# Patient Record
Sex: Male | Born: 1995 | Race: Black or African American | Hispanic: No | Marital: Single | Smoking: Never smoker
Health system: Southern US, Community
[De-identification: ages and names within clinical notes are randomized; demographics above are authoritative.]

---
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
## 2014-01-13 ENCOUNTER — Encounter (HOSPITAL_BASED_OUTPATIENT_CLINIC_OR_DEPARTMENT_OTHER): Payer: Self-pay | Admitting: Emergency Medicine

## 2014-01-13 ENCOUNTER — Emergency Department (HOSPITAL_BASED_OUTPATIENT_CLINIC_OR_DEPARTMENT_OTHER)
Admission: EM | Admit: 2014-01-13 | Discharge: 2014-01-13 | Disposition: A | Discharge status: Home / Self Care | Payer: 59 | Attending: Emergency Medicine | Admitting: Emergency Medicine

## 2014-01-13 DIAGNOSIS — R21 Rash and other nonspecific skin eruption: Secondary | ICD-10-CM | POA: Insufficient documentation

## 2014-01-13 DIAGNOSIS — F17200 Nicotine dependence, unspecified, uncomplicated: Secondary | ICD-10-CM | POA: Diagnosis not present

## 2014-01-13 MED ORDER — PREDNISONE 10 MG PO TABS: ORAL_TABLET | ORAL | Status: DC

## 2014-01-13 MED ORDER — METHYLPREDNISOLONE SODIUM SUCC 125 MG IJ SOLR
INTRAMUSCULAR | Status: AC
  Filled 2014-01-13: qty 2

## 2014-01-13 MED ORDER — METHYLPREDNISOLONE SODIUM SUCC 125 MG IJ SOLR
125.0000 mg | Freq: Once | INTRAMUSCULAR | Status: AC
  Administered 2014-01-13: 125 mg via INTRAVENOUS

## 2014-01-13 NOTE — ED Provider Notes (Signed)
CSN: [id number]     Arrival date & time 01/13/14  1346 History   First MD Initiated Contact with Patient 01/13/14 1405     Chief Complaint  Patient presents with  . Rash     (Consider location/radiation/quality/duration/timing/severity/associated sxs/prior Treatment) Patient is a 18 y.o. male presenting with rash. The history is provided by the patient. No language interpreter was used.  Rash Location:  Full body Quality: itchiness and redness   Severity:  Severe Duration:  8 days Timing:  Constant Progression:  Worsening Chronicity:  New Context: food and new detergent/soap   Relieved by:  Nothing Worsened by:  Nothing tried Associated symptoms: no abdominal pain, no nausea and no shortness of breath     History reviewed. No pertinent past medical history. History reviewed. No pertinent past surgical history. No family history on file. History  Substance Use Topics  . Smoking status: Current Every Day Smoker  . Smokeless tobacco: Not on file  . Alcohol Use: No    Review of Systems  Respiratory: Negative for shortness of breath.   Gastrointestinal: Negative for nausea and abdominal pain.  Skin: Positive for rash.  All other systems reviewed and are negative.     Allergies  Shellfish allergy  Home Medications   Prior to Admission medications   Not on File   BP 122/92  Pulse 68  Temp(Src) 97.5 F (36.4 C) (Oral)  Resp 16  Ht 5\' 8"  (1.727 m)  Wt 140 lb (63.504 kg)  BMI 21.29 kg/m2  SpO2 100% Physical Exam  Nursing note and vitals reviewed. Constitutional: He appears well-developed and well-nourished.  HENT:  Head: Normocephalic and atraumatic.  Eyes: Pupils are equal, round, and reactive to light.  Cardiovascular: Normal rate.   Pulmonary/Chest: Effort normal.  Abdominal: Soft.  Musculoskeletal: Normal range of motion.  Neurological: He is alert.  Skin: Rash noted.  Raised, erythematous rash worse on arms and legs,  Face and back spaired   Psychiatric: He has a normal mood and affect.    ED Course  Procedures (including critical care time) Labs Review Labs Reviewed - No data to display  Imaging Review No results found.   EKG Interpretation None      MDM   Final diagnoses:  Rash    I suspect allergic rash.   Pt given solumedrol  125 IM.  Prednisone taper, benadryl po.      Lonia SkinnerLeslie K AuburnSofia, PA-C 01/13/14 1427

## 2014-01-13 NOTE — ED Notes (Signed)
pa at bedside. 

## 2014-01-13 NOTE — Discharge Instructions (Signed)
Contact Dermatitis Contact dermatitis is a reaction to certain substances that touch the skin. Contact dermatitis can be either irritant contact dermatitis or allergic contact dermatitis. Irritant contact dermatitis does not require previous exposure to the substance for a reaction to occur.Allergic contact dermatitis only occurs if you have been exposed to the substance before. Upon a repeat exposure, your body reacts to the substance.  CAUSES  Many substances can cause contact dermatitis. Irritant dermatitis is most commonly caused by repeated exposure to mildly irritating substances, such as:  Makeup.  Soaps.  Detergents.  Bleaches.  Acids.  Metal salts, such as nickel. Allergic contact dermatitis is most commonly caused by exposure to:  Poisonous plants.  Chemicals (deodorants, shampoos).  Jewelry.  Latex.  Neomycin in triple antibiotic cream.  Preservatives in products, including clothing. SYMPTOMS  The area of skin that is exposed may develop:  Dryness or flaking.  Redness.  Cracks.  Itching.  Pain or a burning sensation.  Blisters. With allergic contact dermatitis, there may also be swelling in areas such as the eyelids, mouth, or genitals.  DIAGNOSIS  Your caregiver can usually tell what the problem is by doing a physical exam. In cases where the cause is uncertain and an allergic contact dermatitis is suspected, a patch skin test may be performed to help determine the cause of your dermatitis. TREATMENT Treatment includes protecting the skin from further contact with the irritating substance by avoiding that substance if possible. Barrier creams, powders, and gloves may be helpful. Your caregiver may also recommend:  Steroid creams or ointments applied 2 times daily. For best results, soak the rash area in cool water for 20 minutes. Then apply the medicine. Cover the area with a plastic wrap. You can store the steroid cream in the refrigerator for a "chilly"  effect on your rash. That may decrease itching. Oral steroid medicines may be needed in more severe cases.  Antibiotics or antibacterial ointments if a skin infection is present.  Antihistamine lotion or an antihistamine taken by mouth to ease itching.  Lubricants to keep moisture in your skin.  Burow's solution to reduce redness and soreness or to dry a weeping rash. Mix one packet or tablet of solution in 2 cups cool water. Dip a clean washcloth in the mixture, wring it out a bit, and put it on the affected area. Leave the cloth in place for 30 minutes. Do this as often as possible throughout the day.  Taking several cornstarch or baking soda baths daily if the area is too large to cover with a washcloth. Harsh chemicals, such as alkalis or acids, can cause skin damage that is like a burn. You should flush your skin for 15 to 20 minutes with cold water after such an exposure. You should also seek immediate medical care after exposure. Bandages (dressings), antibiotics, and pain medicine may be needed for severely irritated skin.  HOME CARE INSTRUCTIONS  Avoid the substance that caused your reaction.  Keep the area of skin that is affected away from hot water, soap, sunlight, chemicals, acidic substances, or anything else that would irritate your skin.  Do not scratch the rash. Scratching may cause the rash to become infected.  You may take cool baths to help stop the itching.  Only take over-the-counter or prescription medicines as directed by your caregiver.  See your caregiver for follow-up care as directed to make sure your skin is healing properly. SEEK MEDICAL CARE IF:   Your condition is not better after 3  days of treatment.  You seem to be getting worse.  You see signs of infection such as swelling, tenderness, redness, soreness, or warmth in the affected area.  You have any problems related to your medicines. Document Released: 05/19/2000 Document Revised: 08/14/2011  Document Reviewed: 10/25/2010 [facility] Patient Information 2015 [place], [place]. This information is not intended to replace advice given to you by your health care provider. Make sure you discuss any questions you have with your health care provider.  Allergies  Allergies may happen from anything your body is sensitive to. This may be food, medicines, pollens, chemicals, and many other things. Food allergies can be severe and deadly.  HOME CARE  If you do not know what causes a reaction, keep a diary. Write down the foods you ate and the symptoms that followed. Avoid foods that cause reactions.  If you have red raised spots (hives) or a rash:  Take medicine as told by your doctor.  Use medicines for red raised spots and itching as needed.  Apply cold cloths (compresses) to the skin. Take a cool bath. Avoid hot baths or showers.  If you are severely allergic:  It is often necessary to go to the hospital after you have treated your reaction.  Wear your medical alert jewelry.  You and your family must learn how to give a allergy shot or use an allergy kit (anaphylaxis kit).  Always carry your allergy kit or shot with you. Use this medicine as told by your doctor if a severe reaction is occurring. GET HELP RIGHT AWAY IF:  You have trouble breathing or are making high-pitched whistling sounds (wheezing).  You have a tight feeling in your chest or throat.  You have a puffy (swollen) mouth.  You have red raised spots, puffiness (swelling), or itching all over your body.  You have had a severe reaction that was helped by your allergy kit or shot. The reaction can return once the medicine has worn off.  You think you are having a food allergy. Symptoms most often happen within 30 minutes of eating a food.  Your symptoms have not gone away within 2 days or are getting worse.  You have new symptoms.  You want to retest yourself with a food or drink you think causes an allergic  reaction. Only do this under the care of a doctor. MAKE SURE YOU:   Understand these instructions.  Will watch your condition.  Will get help right away if you are not doing well or get worse. Document Released: 09/16/2012 Document Reviewed: 09/16/2012 [facility] Patient Information 2015 [place]. This information is not intended to replace advice given to you by your health care provider. Make sure you discuss any questions you have with your health care provider.

## 2014-01-13 NOTE — ED Notes (Signed)
C/o generalized rash started 8 days ago

## 2014-01-15 NOTE — ED Provider Notes (Signed)
Medical screening examination/treatment/procedure(s) were performed by non-physician practitioner and as supervising physician I was immediately available for consultation/collaboration.  Euleta Belson J. Brock Mokry, MD 01/15/14 1747 

## 2016-01-29 ENCOUNTER — Emergency Department (HOSPITAL_BASED_OUTPATIENT_CLINIC_OR_DEPARTMENT_OTHER)
Admission: EM | Admit: 2016-01-29 | Discharge: 2016-01-29 | Disposition: A | Discharge status: Home / Self Care | Payer: BLUE CROSS/BLUE SHIELD | Attending: Emergency Medicine | Admitting: Emergency Medicine

## 2016-01-29 ENCOUNTER — Encounter (HOSPITAL_BASED_OUTPATIENT_CLINIC_OR_DEPARTMENT_OTHER): Payer: Self-pay | Admitting: Emergency Medicine

## 2016-01-29 DIAGNOSIS — E876 Hypokalemia: Secondary | ICD-10-CM | POA: Insufficient documentation

## 2016-01-29 DIAGNOSIS — R197 Diarrhea, unspecified: Secondary | ICD-10-CM | POA: Diagnosis not present

## 2016-01-29 DIAGNOSIS — F17200 Nicotine dependence, unspecified, uncomplicated: Secondary | ICD-10-CM | POA: Diagnosis not present

## 2016-01-29 DIAGNOSIS — R1084 Generalized abdominal pain: Secondary | ICD-10-CM | POA: Diagnosis present

## 2016-01-29 DIAGNOSIS — E860 Dehydration: Secondary | ICD-10-CM | POA: Insufficient documentation

## 2016-01-29 DIAGNOSIS — R143 Flatulence: Secondary | ICD-10-CM | POA: Diagnosis not present

## 2016-01-29 LAB — CBC WITH DIFFERENTIAL/PLATELET
BASOS ABS: 0 10*3/uL (ref 0.0–0.1)
Band Neutrophils: 9 %
Basophils Relative: 0 %
EOS ABS: 0.1 10*3/uL (ref 0.0–0.7)
EOS PCT: 1 %
HEMATOCRIT: 45.7 % (ref 39.0–52.0)
HEMOGLOBIN: 16.2 g/dL (ref 13.0–17.0)
Lymphocytes Relative: 26 %
Lymphs Abs: 2.8 10*3/uL (ref 0.7–4.0)
MCH: 30.5 pg (ref 26.0–34.0)
MCHC: 35.4 g/dL (ref 30.0–36.0)
MCV: 85.9 fL (ref 78.0–100.0)
MONOS PCT: 8 %
Monocytes Absolute: 0.9 10*3/uL (ref 0.1–1.0)
NEUTROS ABS: 6.9 10*3/uL (ref 1.7–7.7)
NEUTROS PCT: 56 %
Platelets: 245 10*3/uL (ref 150–400)
RBC: 5.32 MIL/uL (ref 4.22–5.81)
RDW: 12.3 % (ref 11.5–15.5)
WBC: 10.7 10*3/uL — AB (ref 4.0–10.5)

## 2016-01-29 LAB — COMPREHENSIVE METABOLIC PANEL
ALBUMIN: 3.8 g/dL (ref 3.5–5.0)
ALK PHOS: 56 U/L (ref 38–126)
ALT: 14 U/L — ABNORMAL LOW (ref 17–63)
ANION GAP: 7 (ref 5–15)
AST: 18 U/L (ref 15–41)
BILIRUBIN TOTAL: 0.5 mg/dL (ref 0.3–1.2)
BUN: 11 mg/dL (ref 6–20)
CO2: 28 mmol/L (ref 22–32)
Calcium: 9 mg/dL (ref 8.9–10.3)
Chloride: 102 mmol/L (ref 101–111)
Creatinine, Ser: 1.03 mg/dL (ref 0.61–1.24)
GFR calc Af Amer: >60 mL/min (ref >60)
GFR calc non Af Amer: >60 mL/min (ref >60)
GLUCOSE: 94 mg/dL (ref 65–99)
POTASSIUM: 3.1 mmol/L — AB (ref 3.5–5.1)
SODIUM: 137 mmol/L (ref 135–145)
Total Protein: 6.6 g/dL (ref 6.5–8.1)

## 2016-01-29 LAB — MAGNESIUM: Magnesium: 2 mg/dL (ref 1.7–2.4)

## 2016-01-29 LAB — C DIFFICILE QUICK SCREEN W PCR REFLEX
C DIFFICILE (CDIFF) INTERP: NOT DETECTED
C DIFFICILE (CDIFF) TOXIN: NEGATIVE
C Diff antigen: NEGATIVE

## 2016-01-29 MED ORDER — POTASSIUM CHLORIDE 10 MEQ/100ML IV SOLN
10.0000 meq | Freq: Once | INTRAVENOUS | Status: AC
  Administered 2016-01-29: 10 meq via INTRAVENOUS
  Filled 2016-01-29: qty 100

## 2016-01-29 MED ORDER — ONDANSETRON HCL 4 MG PO TABS: 4.0000 mg | ORAL_TABLET | Freq: Four times a day (QID) | ORAL | 0 refills | Status: DC

## 2016-01-29 MED ORDER — ONDANSETRON HCL 4 MG/2ML IJ SOLN
4.0000 mg | Freq: Once | INTRAMUSCULAR | Status: AC
  Administered 2016-01-29: 4 mg via INTRAVENOUS
  Filled 2016-01-29: qty 2

## 2016-01-29 MED ORDER — SODIUM CHLORIDE 0.9 % IV BOLUS (SEPSIS)
1000.0000 mL | Freq: Once | INTRAVENOUS | Status: AC
  Administered 2016-01-29: 1000 mL via INTRAVENOUS

## 2016-01-29 MED ORDER — METRONIDAZOLE 500 MG PO TABS: 500.0000 mg | ORAL_TABLET | Freq: Two times a day (BID) | ORAL | 0 refills | Status: DC

## 2016-01-29 MED ORDER — LOPERAMIDE HCL 2 MG PO CAPS
4.0000 mg | ORAL_CAPSULE | Freq: Once | ORAL | Status: AC
  Administered 2016-01-29: 4 mg via ORAL
  Filled 2016-01-29: qty 2

## 2016-01-29 NOTE — ED Notes (Signed)
MD at bedside. 

## 2016-01-29 NOTE — ED Provider Notes (Signed)
[facility]   CSN: [id number] Arrival date & time: 01/29/16  1030     History   Chief Complaint Chief Complaint  Patient presents with  . Abdominal Pain    HPI Martin Payne is a 20 y.o. male.  Patient is a healthy 20 year old male who presents today with 2 weeks of worsening diarrhea and occasional nausea and vomiting. Patient states approximately 2 weeks ago he developed diarrhea which she was having numerous stools per day. The last thing he remembers eating was chicken from a fast food restaurant but it tasted normal. Then approximately 7 days ago the diarrhea became worse and he is now having usually 1-2 episodes of vomiting per day. Anytime he tries to eat or drink he has worsening diarrhea. Today he has already had 5 episodes of diarrhea. He denies any blood in his stool or vomit. In the evenings he gets chills but has not had a documented fever. No cough or shortness of breath. He has not had anything like this before. He denies any tobacco, drugs or alcohol. His doctor last week gave him a course of ciprofloxacin for concern for food borne illness but that just made him feel worse. He denies any antibiotic exposure in the last few months except for the Cipro once his symptoms had started. No travel and no other sick contacts.   The history is provided by the patient.  Abdominal Pain   This is a new problem. Episode onset: 2 weeks. The problem occurs constantly. The problem has not changed since onset.The pain is associated with an unknown factor. The pain is located in the generalized abdominal region. The pain is at a severity of 4/10. The pain is moderate. Associated symptoms include diarrhea, flatus, nausea and vomiting. Pertinent negatives include melena, constipation, dysuria, headaches and arthralgias. Associated symptoms comments: Chills. The symptoms are aggravated by eating. Nothing relieves the symptoms. Past workup comments: Patient saw PCP last  week and was given Cipro due to concern for food borne illness or just made him feel worse. His past medical history does not include ulcerative colitis or Crohn's disease.    No past medical history on file.  There are no active problems to display for this patient.   No past surgical history on file.     Home Medications    Prior to Admission medications   Not on File    Family History No family history on file.  Social History Social History  Substance Use Topics  . Smoking status: Current Every Day Smoker  . Smokeless tobacco: Never Used  . Alcohol use No     Allergies   Shellfish allergy   Review of Systems Review of Systems  Constitutional: Positive for appetite change, chills and unexpected weight change.       Since illness started patient has lost 10 pounds  Gastrointestinal: Positive for abdominal pain, diarrhea, flatus, nausea and vomiting. Negative for constipation and melena.  Genitourinary: Negative for dysuria.  Musculoskeletal: Negative for arthralgias.  Neurological: Negative for headaches.  All other systems reviewed and are negative.    Physical Exam Updated Vital Signs BP 134/85 (BP Location: Right Arm)   Pulse 70   Temp 98.2 F (36.8 C) (Oral)   Resp 18   Ht 5\' 8"  (1.727 m)   Wt 125 lb (56.7 kg)   SpO2 97%   BMI 19.01 kg/m   Physical Exam  Constitutional: He is oriented to person, place, and time. He appears well-developed  and well-nourished. No distress.  HENT:  Head: Normocephalic and atraumatic.  Mouth/Throat: Oropharynx is clear and moist. Mucous membranes are dry.  Eyes: Conjunctivae and EOM are normal. Pupils are equal, round, and reactive to light.  Neck: Normal range of motion. Neck supple.  Cardiovascular: Normal rate, regular rhythm and intact distal pulses.   No murmur heard. Pulmonary/Chest: Effort normal and breath sounds normal. No respiratory distress. He has no wheezes. He has no rales.  Abdominal: Soft. He  exhibits no distension. There is tenderness. There is no rebound and no guarding.  Mild diffuse tenderness  Musculoskeletal: Normal range of motion. He exhibits no edema or tenderness.  Neurological: He is alert and oriented to person, place, and time.  Skin: Skin is warm and dry. No rash noted. No erythema.  Psychiatric: He has a normal mood and affect. His behavior is normal.  Nursing note and vitals reviewed.    ED Treatments / Results  Labs (all labs ordered are listed, but only abnormal results are displayed) Labs Reviewed  CBC WITH DIFFERENTIAL/PLATELET - Abnormal; Notable for the following:       Result Value   WBC 10.7 (*)    All other components within normal limits  COMPREHENSIVE METABOLIC PANEL - Abnormal; Notable for the following:    Potassium 3.1 (*)    ALT 14 (*)    All other components within normal limits  GASTROINTESTINAL PANEL BY PCR, STOOL (REPLACES STOOL CULTURE)  C DIFFICILE QUICK SCREEN W PCR REFLEX  MAGNESIUM    EKG  EKG Interpretation None       Radiology No results found.  Procedures Procedures (including critical care time)  Medications Ordered in ED Medications  potassium chloride 10 mEq in 100 mL IVPB (10 mEq Intravenous New Bag/Given 01/29/16 1205)  sodium chloride 0.9 % bolus 1,000 mL (0 mLs Intravenous Stopped 01/29/16 1204)  ondansetron (ZOFRAN) injection 4 mg (4 mg Intravenous Given 01/29/16 1109)     Initial Impression / Assessment and Plan / ED Course  I have reviewed the triage vital signs and the nursing notes.  Pertinent labs & imaging results that were available during my care of the patient were reviewed by me and considered in my medical decision making (see chart for details).  Clinical Course    Patient is a healthy 20 year old male presenting today with a two-week history of profuse diarrhea and intermittent vomiting. Documented fever but poor by mouth intake with significant weight loss of 10 pounds in the last 2  weeks.  Patient seen last week and given Cipro for concern for potential food poisoning however he has not had any stool samples done. Patient is hemodynamically stable today with mild diffuse abdominal tenderness but no peritoneal signs. He denies having any blood in his stool and no prior history of Crohn's disease or ulcerative colitis. Patient has not been on any other antibiotics or had any travel. Concern for potential C. difficile as patient is having numerous stools which is classic for C. difficile. Low suspicion for appendicitis, viscus, pancreatitis, cholecystitis or diverticulitis.  CBC, CMP, GI pathogen panel and C. difficile PCR pending. Patient given IV fluids and Zofran.  12:10 PM Mild hypokalemia at 3.1. Patient feeling better after IV fluids and Zofran. Patient given ginger ale and chronic sinus and attempts to obtain a stool sample  1:57 PM  Pt given flagyl for concern for c-diff and to use probiotic and zofran prn.    Final Clinical Impressions(s) / ED Diagnoses   Final  diagnoses:  Diarrhea, unspecified type  Dehydration  Hypokalemia    New Prescriptions New Prescriptions   METRONIDAZOLE (FLAGYL) 500 MG TABLET    Take 1 tablet (500 mg total) by mouth 2 (two) times daily.   ONDANSETRON (ZOFRAN) 4 MG TABLET    Take 1 tablet (4 mg total) by mouth every 6 (six) hours.     Gwyneth Sprout, MD 01/29/16 1359

## 2016-01-29 NOTE — ED Triage Notes (Signed)
Pt having N/V/D for over two weeks.  Pt having increased diarrhea.  Pt denies fever but feels hot.  Pt states he recently has been on cipro without relief.

## 2016-01-30 ENCOUNTER — Telehealth (HOSPITAL_BASED_OUTPATIENT_CLINIC_OR_DEPARTMENT_OTHER): Payer: Self-pay | Admitting: *Deleted

## 2016-01-30 LAB — GASTROINTESTINAL PANEL BY PCR, STOOL (REPLACES STOOL CULTURE)
ADENOVIRUS F40/41: NOT DETECTED
Astrovirus: NOT DETECTED
CRYPTOSPORIDIUM: NOT DETECTED
Campylobacter species: DETECTED — AB
Cyclospora cayetanensis: NOT DETECTED
E. coli O157: NOT DETECTED
ENTAMOEBA HISTOLYTICA: NOT DETECTED
ENTEROAGGREGATIVE E COLI (EAEC): NOT DETECTED
Enteropathogenic E coli (EPEC): DETECTED — AB
Enterotoxigenic E coli (ETEC): NOT DETECTED
GIARDIA LAMBLIA: DETECTED — AB
NOROVIRUS GI/GII: NOT DETECTED
Plesimonas shigelloides: NOT DETECTED
Rotavirus A: NOT DETECTED
SALMONELLA SPECIES: NOT DETECTED
SHIGELLA/ENTEROINVASIVE E COLI (EIEC): NOT DETECTED
Sapovirus (I, II, IV, and V): NOT DETECTED
Shiga like toxin producing E coli (STEC): NOT DETECTED
VIBRIO CHOLERAE: NOT DETECTED
Vibrio species: NOT DETECTED
YERSINIA ENTEROCOLITICA: NOT DETECTED

## 2016-01-31 ENCOUNTER — Telehealth (HOSPITAL_BASED_OUTPATIENT_CLINIC_OR_DEPARTMENT_OTHER): Payer: Self-pay | Admitting: Emergency Medicine

## 2016-01-31 NOTE — Telephone Encounter (Signed)
Post ED Visit - Positive Culture Follow-up: Successful Patient Follow-Up  Culture assessed and recommendations reviewed by: []  Enzo BiNathan Batchelder, Pharm.D. []  Celedonio MiyamotoJeremy Frens, Pharm.D., BCPS []  Garvin FilaMike Maccia, Pharm.D. []  Georgina PillionElizabeth Martin, Pharm.D., BCPS []  OlimpoMinh Pham, [address]Pharm[address]D., BCPS, AAHIVP []  Estella HuskMichelle Turner, Pharm.D., BCPS, AAHIVP []  Tennis Mustassie Stewart, [address]Pharm[address] []  Sherle Poeob Vincent, [address]Ph[address]  Positive stool culture  []  Patient discharged without antimicrobial prescription and treatment is now indicated []  Organism is resistant to prescribed ED discharge antimicrobial []  Patient with positive blood cultures  Changes discussed with ED provider: Gwyneth SproutWhitney Plunkett MD New antibiotic prescription continue/complete ciprofloxacin and flagyl Encourage f/u with PCP, return ED if clinically worsens  Attempting to contact patient   Berle MullMiller, Laneisha Mino 01/31/2016, 11:52 AM

## 2016-03-29 ENCOUNTER — Telehealth (HOSPITAL_BASED_OUTPATIENT_CLINIC_OR_DEPARTMENT_OTHER): Payer: Self-pay | Admitting: Emergency Medicine

## 2016-03-29 NOTE — Telephone Encounter (Signed)
Lost to followup 

## 2017-12-24 ENCOUNTER — Other Ambulatory Visit: Payer: Self-pay

## 2017-12-24 ENCOUNTER — Emergency Department (HOSPITAL_BASED_OUTPATIENT_CLINIC_OR_DEPARTMENT_OTHER)
Admission: EM | Admit: 2017-12-24 | Discharge: 2017-12-24 | Disposition: A | Discharge status: Home / Self Care | Payer: BLUE CROSS/BLUE SHIELD | Attending: Emergency Medicine | Admitting: Emergency Medicine

## 2017-12-24 ENCOUNTER — Encounter (HOSPITAL_BASED_OUTPATIENT_CLINIC_OR_DEPARTMENT_OTHER): Payer: Self-pay | Admitting: Emergency Medicine

## 2017-12-24 DIAGNOSIS — Z113 Encounter for screening for infections with a predominantly sexual mode of transmission: Secondary | ICD-10-CM | POA: Diagnosis not present

## 2017-12-24 DIAGNOSIS — R21 Rash and other nonspecific skin eruption: Secondary | ICD-10-CM | POA: Insufficient documentation

## 2017-12-24 DIAGNOSIS — F17200 Nicotine dependence, unspecified, uncomplicated: Secondary | ICD-10-CM | POA: Insufficient documentation

## 2017-12-24 LAB — URINALYSIS, ROUTINE W REFLEX MICROSCOPIC
Bilirubin Urine: NEGATIVE
Glucose, UA: NEGATIVE mg/dL
Hgb urine dipstick: NEGATIVE
Ketones, ur: NEGATIVE mg/dL
LEUKOCYTES UA: NEGATIVE
NITRITE: NEGATIVE
PH: 7 (ref 5.0–8.0)
Protein, ur: NEGATIVE mg/dL
SPECIFIC GRAVITY, URINE: 1.015 (ref 1.005–1.030)

## 2017-12-24 MED ORDER — SELENIUM SULFIDE 2.3 % EX SHAM: 1.0000 "application " | MEDICATED_SHAMPOO | Freq: Every day | CUTANEOUS | 0 refills | Status: AC

## 2017-12-24 NOTE — ED Provider Notes (Signed)
[facility] EMERGENCY DEPARTMENT Provider Note   CSN: [id number] Arrival date & time: 12/24/17  0849     History   Chief Complaint Chief Complaint  Patient presents with  . Rash    HPI Colon FlatteryMalcolm Payne is a 22 y.o. male with a hx of tobacco abuse who presents to the ED with complaints of rash to the testicular region over the past 48 hours. Patient states that he developed an itchy pink spotted rash to his testicles. It has not necessarily improved/worsened since onset. It is more noticeably itchy at night and when hot water hits it, otherwise no specific alleviating/aggravating factors. He has not tried intervention prior to arrival. Denies fever, chills, nausea, or vomiting. Denies penile discharge, dysuria, testicular pain, or testicular swelling. Patient is sexually active in a monogamous relationship with 1 male, he has low concern for STDs. He states that he did get bit by an insect in penile shaft area 1 month ago, this completely resolved with home remedies- oils and oatmeal, has not reoccurred, this rash is in a new location. No rash other than to testicles.   HPI  No past medical history on file.  There are no active problems to display for this patient.   No past surgical history on file.      Home Medications    Prior to Admission medications   Not on File    Family History No family history on file.  Social History Social History   Tobacco Use  . Smoking status: Current Every Day Smoker  . Smokeless tobacco: Never Used  Substance Use Topics  . Alcohol use: No  . Drug use: No     Allergies   Shellfish allergy   Review of Systems Review of Systems  Constitutional: Negative for chills and fever.  Gastrointestinal: Negative for abdominal pain, nausea and vomiting.  Genitourinary: Negative for discharge, dysuria, penile pain, scrotal swelling, testicular pain and urgency.  Skin: Positive for rash.    Physical Exam Updated Vital  Signs BP 136/86 (BP Location: Right Arm)   Pulse 81   Temp 98.5 F (36.9 C) (Oral)   Resp 18   Ht 5\' 10"  (1.778 m)   Wt 64.9 kg (143 lb)   SpO2 100%   BMI 20.52 kg/m   Physical Exam  Constitutional: He appears well-developed and well-nourished. No distress.  HENT:  Head: Normocephalic and atraumatic.  Eyes: Conjunctivae are normal. Right eye exhibits no discharge. Left eye exhibits no discharge.  Abdominal: Soft. He exhibits no distension. There is no tenderness. There is no rebound and no guarding.  Genitourinary: Penis normal. Cremasteric reflex is present. Right testis shows no mass, no swelling and no tenderness. Left testis shows no mass, no swelling and no tenderness. Circumcised. No penile erythema or penile tenderness. No discharge found.  Genitourinary Comments: Patient has multiple hypopigmented fairly circumscribed areas ranging from approximately 2-7 mm in diameter to the scrotum, does not extend past scrotal area. Nontender to palpation. No scaling. No increased warmth. No palpable fluctuance/induration. No pustules. No papules. No vesicles. Brett CanalesSteve RT present as chaperone.   Neurological: He is alert.  Clear speech.   Psychiatric: He has a normal mood and affect. His behavior is normal. Thought content normal.  Nursing note and vitals reviewed.    ED Treatments / Results  Labs Results for orders placed or performed during the hospital encounter of 12/24/17  Urinalysis, Routine w reflex microscopic  Result Value Ref Range   Color, Urine YELLOW  YELLOW   APPearance CLEAR CLEAR   Specific Gravity, Urine 1.015 1.005 - 1.030   pH 7.0 5.0 - 8.0   Glucose, UA NEGATIVE NEGATIVE mg/dL   Hgb urine dipstick NEGATIVE NEGATIVE   Bilirubin Urine NEGATIVE NEGATIVE   Ketones, ur NEGATIVE NEGATIVE mg/dL   Protein, ur NEGATIVE NEGATIVE mg/dL   Nitrite NEGATIVE NEGATIVE   Leukocytes, UA NEGATIVE NEGATIVE   No results found. EKG None  Radiology No results  found.  Procedures Procedures (including critical care time)  Medications Ordered in ED Medications - No data to display   Initial Impression / Assessment and Plan / ED Course  I have reviewed the triage vital signs and the nursing notes.  Pertinent labs & imaging results that were available during my care of the patient were reviewed by me and considered in my medical decision making (see chart for details).   Patient presents to the emergency department with rash to the scrotum.  Patient nontoxic-appearing, no apparent distress, vitals WNL.  Rash is well localized to scrotal area, there is no palm/sole involvement.  Rash is not vesicular, pustular, warm to touch, fluctuant, or with desquamation.  No dusky purpura or central bulla. The area is not tender to palpation. Do not suspect life threatening condition such as SJS, TEN, tick borne illness, or syphilis. Patient agreeable to STD testing which was performed including gonorrhea, chlamydia, syphilis, HIV, and urine, UA unremarkable, remaining tests pending- patient offered GC/chlamydia prophylaxis- declined and would prefer to wait for results, I feel this is reasonable. He is aware he has test pending and will need to inform all sexual partners of positive and seek treatment accordingly.  Unclear definitive etiology to patient's rash, will trial selenium sulfide topically given hypopigmented, not raised and itchy, recommended close primary care follow-up. I discussed results, treatment plan, need for PCP follow-up, and return precautions with the patient. Provided opportunity for questions, patient confirmed understanding and is in agreement with plan.   Findings and plan of care discussed with supervising physician Dr. Madilyn Hook who personally evaluated and examined this patient and is in agreement with plan.   Final Clinical Impressions(s) / ED Diagnoses   Final diagnoses:  Rash  Screen for STD (sexually transmitted disease)    ED Discharge  Orders        Ordered    Selenium Sulfide 2.3 % SHAM  Daily     12/24/17 [address] [place], PA-C 12/24/17 1918    Tilden Fossa, MD 12/25/17 1517

## 2017-12-24 NOTE — Discharge Instructions (Addendum)
You were seen in the emergency department today for a rash to your testicular region.  We are unsure of exactly what is causing the rash.  We would like you to try selenium sulfide topical shampoo in order to help try to treat this.  Please use 1 application daily for the next 7 days, is a small amount of shampoo mixed with a little bit of water, leave this on for 10 minutes, then thoroughly rinse.  We would like you to have this rash reevaluated by a primary care provider within 1 week.  If you do not have a primary care provider please call the phone number circled in your discharge instructions.  Additionally you have STD tests that are pending, we will call you if these results are positive, if positive you will need to inform all sexual partners and seek treatment.  Return to the ER anytime for new or worsening symptoms including but not limited to fever, spreading rash, redness, warmth to the area, testicular pain, or any other concerns.

## 2017-12-24 NOTE — ED Triage Notes (Signed)
Pt states he had a bug bite to testicle about one month ago and area went away.  Pt has been using tea tree oil and oatmeal bath for itching of the bite.  Pt started having rash about two days on both testicles.

## 2017-12-25 LAB — GC/CHLAMYDIA PROBE AMP (~~LOC~~) NOT AT ARMC
Chlamydia: NEGATIVE
NEISSERIA GONORRHEA: NEGATIVE

## 2017-12-25 LAB — HIV ANTIBODY (ROUTINE TESTING W REFLEX): HIV SCREEN 4TH GENERATION: NONREACTIVE

## 2017-12-25 LAB — URINE CULTURE: CULTURE: NO GROWTH

## 2017-12-26 LAB — SYPHILIS: RPR W/REFLEX TO RPR TITER AND TREPONEMAL ANTIBODIES, TRADITIONAL SCREENING AND DIAGNOSIS ALGORITHM: RPR Ser Ql: REACTIVE — AB

## 2017-12-26 LAB — RPR, QUANT+TP ABS (REFLEX)
Rapid Plasma Reagin, Quant: 1:32 {titer} — ABNORMAL HIGH
T Pallidum Abs: POSITIVE — AB

## 2017-12-27 ENCOUNTER — Encounter (HOSPITAL_BASED_OUTPATIENT_CLINIC_OR_DEPARTMENT_OTHER): Payer: Self-pay | Admitting: *Deleted

## 2017-12-27 ENCOUNTER — Emergency Department (HOSPITAL_BASED_OUTPATIENT_CLINIC_OR_DEPARTMENT_OTHER)
Admission: EM | Admit: 2017-12-27 | Discharge: 2017-12-27 | Disposition: A | Discharge status: Home / Self Care | Payer: BLUE CROSS/BLUE SHIELD | Attending: Emergency Medicine | Admitting: Emergency Medicine

## 2017-12-27 ENCOUNTER — Other Ambulatory Visit: Payer: Self-pay

## 2017-12-27 DIAGNOSIS — F17200 Nicotine dependence, unspecified, uncomplicated: Secondary | ICD-10-CM | POA: Insufficient documentation

## 2017-12-27 DIAGNOSIS — A5139 Other secondary syphilis of skin: Secondary | ICD-10-CM | POA: Insufficient documentation

## 2017-12-27 DIAGNOSIS — R21 Rash and other nonspecific skin eruption: Secondary | ICD-10-CM | POA: Diagnosis present

## 2017-12-27 MED ORDER — PENICILLIN G BENZATHINE 1200000 UNIT/2ML IM SUSP
2.4000 10*6.[IU] | Freq: Once | INTRAMUSCULAR | Status: AC
  Administered 2017-12-27: 2.4 10*6.[IU] via INTRAMUSCULAR
  Filled 2017-12-27: qty 4

## 2017-12-27 MED ORDER — PENICILLIN G BENZATHINE 1200000 UNIT/2ML IM SUSP
INTRAMUSCULAR | Status: AC
  Filled 2017-12-27: qty 2

## 2017-12-27 NOTE — ED Triage Notes (Signed)
He was seen for a rash 3 days ago and had a STD check. He received a call to come back to the ED for a PCN injection for testing positive for syphilis.

## 2017-12-27 NOTE — ED Provider Notes (Signed)
[facility] EMERGENCY DEPARTMENT Provider Note   CSN: [id number] Arrival date & time: 12/27/17  1302     History   Chief Complaint Chief Complaint  Patient presents with  . Rash    HPI Martin Payne is a 22 y.o. male.  HPI Patient was evaluated 2 days ago for rash to the scrotum.  States there is been no change to the rash.  No new rashes.  Does state he has had some fatigue but no fever or chills.  No nausea or vomiting.  No penile discharge or testicular swelling.  Was called with positive RPR results. History reviewed. No pertinent past medical history.  There are no active problems to display for this patient.   History reviewed. No pertinent surgical history.      Home Medications    Prior to Admission medications   Medication Sig Start Date End Date Taking? Authorizing Provider  Selenium Sulfide 2.3 % SHAM Apply 1 application topically daily for 7 days. Apply small amount of shampoo with water, leave on for 10 minutes, rinse thoroughly 12/24/17 12/31/17 Yes Petrucelli, Pleas Koch, PA-C    Family History No family history on file.  Social History Social History   Tobacco Use  . Smoking status: Current Every Day Smoker  . Smokeless tobacco: Never Used  Substance Use Topics  . Alcohol use: No  . Drug use: No     Allergies   Shellfish allergy   Review of Systems Review of Systems  Constitutional: Positive for fatigue. Negative for chills and fever.  HENT: Positive for congestion. Negative for sore throat.   Eyes: Negative for visual disturbance.  Respiratory: Negative for shortness of breath.   Gastrointestinal: Negative for abdominal pain, nausea and vomiting.  Genitourinary: Negative for discharge, dysuria, frequency, penile pain, scrotal swelling and testicular pain.  Musculoskeletal: Negative for neck pain and neck stiffness.  Skin: Positive for rash.  Neurological: Negative for numbness and headaches.  All other systems reviewed and  are negative.    Physical Exam Updated Vital Signs BP 129/74 (BP Location: Left Arm)   Pulse (!) 101   Temp 98.2 F (36.8 C) (Oral)   Resp 18   Ht 5\' 9"  (1.753 m)   Wt 64.9 kg (143 lb)   SpO2 100%   BMI 21.12 kg/m   Physical Exam  Constitutional: He is oriented to person, place, and time. He appears well-developed and well-nourished. No distress.  HENT:  Head: Normocephalic and atraumatic.  Mouth/Throat: Oropharynx is clear and moist. No oropharyngeal exudate.  Eyes: Pupils are equal, round, and reactive to light. EOM are normal.  Neck: Normal range of motion. Neck supple.  Cardiovascular: Normal rate and regular rhythm.  Pulmonary/Chest: Effort normal and breath sounds normal. No respiratory distress. He has no wheezes. He has no rales.  Abdominal: Soft. Bowel sounds are normal. He exhibits no mass. There is no guarding. No hernia.  Genitourinary:  Genitourinary Comments: Hypopigmented macular lesions to the scrotum.  No testicular tenderness or swelling.  No penile discharge.  No inguinal lymphadenopathy.  Musculoskeletal: Normal range of motion. He exhibits no edema or tenderness.  Lymphadenopathy:    He has no cervical adenopathy.  Neurological: He is alert and oriented to person, place, and time.  Skin: Skin is warm and dry. Capillary refill takes less than 2 seconds. Rash noted. He is not diaphoretic. No erythema.  Psychiatric: He has a normal mood and affect. His behavior is normal.  Nursing note and vitals reviewed.  ED Treatments / Results  Labs (all labs ordered are listed, but only abnormal results are displayed) Labs Reviewed - No data to display  EKG None  Radiology No results found.  Procedures Procedures (including critical care time)  Medications Ordered in ED Medications  penicillin g benzathine (BICILLIN LA) 1200000 UNIT/2ML injection 2.4 Million Units (has no administration in time range)     Initial Impression / Assessment and Plan / ED  Course  I have reviewed the triage vital signs and the nursing notes.  Pertinent labs & imaging results that were available during my care of the patient were reviewed by me and considered in my medical decision making (see chart for details).     Given IM dose of penicillin.  Advised to have all sexual partners evaluated and treated.  We will follow-up with the health department.  Return precautions given.  Final Clinical Impressions(s) / ED Diagnoses   Final diagnoses:  Rash of secondary syphilis    ED Discharge Orders    None       Loren RacerYelverton, Loralyn Rachel, MD 12/27/17 1336

## 2022-05-06 ENCOUNTER — Emergency Department (HOSPITAL_BASED_OUTPATIENT_CLINIC_OR_DEPARTMENT_OTHER)
Admission: EM | Admit: 2022-05-06 | Discharge: 2022-05-06 | Disposition: A | Discharge status: Home / Self Care | Payer: 59 | Attending: Emergency Medicine | Admitting: Emergency Medicine

## 2022-05-06 ENCOUNTER — Encounter (HOSPITAL_BASED_OUTPATIENT_CLINIC_OR_DEPARTMENT_OTHER): Payer: Self-pay | Admitting: Emergency Medicine

## 2022-05-06 DIAGNOSIS — K644 Residual hemorrhoidal skin tags: Secondary | ICD-10-CM

## 2022-05-06 MED ORDER — HYDROCORTISONE (PERIANAL) 2.5 % EX CREA: 1.0000 | TOPICAL_CREAM | Freq: Two times a day (BID) | CUTANEOUS | 0 refills | Status: AC

## 2022-05-06 NOTE — Discharge Instructions (Addendum)
Patient recommended for sitz bath's, hemorrhoid cream with a mixture of phenylephrine and lidocaine, and decreasing Valsalva resulting activities including diarrhea, constipation, and heavy lifting. No rectal penetration.   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  Information for on-call general surgeon provided in case symptoms do not improve the next couple of months.  Please follow-up with a general surgeon or the 1 provided in the next 1 to 2 months if his symptoms of hemorrhoid pain does not improve.

## 2022-05-06 NOTE — ED Notes (Signed)
Pt discharged to home. Discharge instructions have been discussed with patient and/or family members. Pt verbally acknowledges understanding d/c instructions, and endorses comprehension to checkout at registration before leaving.  °

## 2022-05-06 NOTE — ED Provider Notes (Addendum)
[facility] EMERGENCY DEPARTMENT Provider Note   CSN: [id number] Arrival date & time: 05/06/22  9629     History  Chief Complaint  Patient presents with   Rectal Pain    Martin Payne is a 26 y.o. male.  26 yo male presenting for rectal pain, pressure, and small about of hematochezia after several days of diarrhea from food borne illness. Pt denies fevers, chills, or myalgias. Denies previous hx of hemorrhoids. Admits to pain with sitting and bowel movements.   The history is provided by the patient. No language interpreter was used.       Home Medications Prior to Admission medications   Medication Sig Start Date End Date Taking? Authorizing Provider  hydrocortisone (ANUSOL-HC) 2.5 % rectal cream Place 1 Application rectally 2 (two) times daily. 05/06/22  Yes Edwin Dada P, DO      Allergies    Shellfish allergy    Review of Systems   Review of Systems  Constitutional:  Negative for chills and fever.  HENT:  Negative for ear pain and sore throat.   Eyes:  Negative for pain and visual disturbance.  Respiratory:  Negative for cough and shortness of breath.   Cardiovascular:  Negative for chest pain and palpitations.  Gastrointestinal:  Positive for blood in stool and rectal pain. Negative for abdominal pain and vomiting.  Genitourinary:  Negative for dysuria and hematuria.  Musculoskeletal:  Negative for arthralgias and back pain.  Skin:  Negative for color change and rash.  Neurological:  Negative for seizures and syncope.  All other systems reviewed and are negative.   Physical Exam Updated Vital Signs BP (!) 140/93 (BP Location: Left Arm)   Pulse 75   Temp 98.2 F (36.8 C) (Oral)   Resp 16   Ht 5' 9.25" (1.759 m)   Wt 62.1 kg   SpO2 100%   BMI 20.09 kg/m  Physical Exam Vitals and nursing note reviewed. Exam conducted with a chaperone present.  Constitutional:      General: He is not in acute distress.    Appearance: He is well-developed.   HENT:     Head: Normocephalic and atraumatic.  Eyes:     Conjunctiva/sclera: Conjunctivae normal.  Cardiovascular:     Rate and Rhythm: Normal rate and regular rhythm.     Heart sounds: No murmur heard. Pulmonary:     Effort: Pulmonary effort is normal. No respiratory distress.     Breath sounds: Normal breath sounds.  Abdominal:     Palpations: Abdomen is soft.     Tenderness: There is no abdominal tenderness.  Genitourinary:    Rectum: External hemorrhoid present.    Musculoskeletal:        General: No swelling.     Cervical back: Neck supple.  Skin:    General: Skin is warm and dry.     Capillary Refill: Capillary refill takes less than 2 seconds.  Neurological:     Mental Status: He is alert.  Psychiatric:        Mood and Affect: Mood normal.     ED Results / Procedures / Treatments   Labs (all labs ordered are listed, but only abnormal results are displayed) Labs Reviewed - No data to display  EKG None  Radiology No results found.  Procedures Procedures    Medications Ordered in ED Medications - No data to display  ED Course/ Medical Decision Making/ A&P  Medical Decision Making Risk Prescription drug management.   7:33 AM  27 yo male presenting for rectal pain, pressure, and small about of hematochezia after several days of diarrhea from food borne illness.  Patient is alert and oriented x3, no acute distress, afebrile, stable vital signs.  Chaperoned physical exam demonstrates non-thrombosed, fluctuant, external hemorrhoid present at 10 o'clock position. No surrounding erythema, swelling, or masses.   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 No rashes, ulcers, or abscesses on exam.   Patient recommended for sitz bath's, hydrocortisone rectal cream, and decreasing Valsalva resulting activities including diarrhea, constipation, and heavy lifting.  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   Information for on-call general surgeon provided in case symptoms do not improve the next couple of months.  Patient in no distress and overall condition improved here in the ED. Detailed discussions were had with the patient regarding current findings, and need for close f/u with PCP or on call doctor. The patient has been instructed to return immediately if the symptoms worsen in any way for re-evaluation. Patient verbalized understanding and is in agreement with current care plan. All questions answered prior to discharge.         Final Clinical Impression(s) / ED Diagnoses Final diagnoses:  External hemorrhoid    Rx / DC Orders ED Discharge Orders          Ordered    hydrocortisone (ANUSOL-HC) 2.5 % rectal cream  2 times daily        05/06/22 0733              Lianne Cure, DO [id number] [phone]    Lianne Cure, DO [id number] [phone]

## 2022-05-06 NOTE — ED Triage Notes (Signed)
Pt states he ate chipolte on last Tuesday and since then he has been having issues using the bathroom  Pt states he has been straining a lot and now he feels like he has something external coming from his rectum  Pt states he has a lot of pain to that area and is unable to sit  Pt also states he has blood on the tissue when he wipes

## 2023-04-14 ENCOUNTER — Emergency Department (HOSPITAL_BASED_OUTPATIENT_CLINIC_OR_DEPARTMENT_OTHER): Payer: BC Managed Care – PPO

## 2023-04-14 ENCOUNTER — Encounter (HOSPITAL_BASED_OUTPATIENT_CLINIC_OR_DEPARTMENT_OTHER): Payer: Self-pay | Admitting: Emergency Medicine

## 2023-04-14 ENCOUNTER — Emergency Department (HOSPITAL_BASED_OUTPATIENT_CLINIC_OR_DEPARTMENT_OTHER)
Admission: EM | Admit: 2023-04-14 | Discharge: 2023-04-14 | Disposition: A | Discharge status: Home / Self Care | Payer: BC Managed Care – PPO | Attending: Emergency Medicine | Admitting: Emergency Medicine

## 2023-04-14 ENCOUNTER — Other Ambulatory Visit: Payer: Self-pay

## 2023-04-14 DIAGNOSIS — W208XXA Other cause of strike by thrown, projected or falling object, initial encounter: Secondary | ICD-10-CM | POA: Diagnosis not present

## 2023-04-14 DIAGNOSIS — S9031XA Contusion of right foot, initial encounter: Secondary | ICD-10-CM | POA: Diagnosis not present

## 2023-04-14 DIAGNOSIS — Y990 Civilian activity done for income or pay: Secondary | ICD-10-CM | POA: Insufficient documentation

## 2023-04-14 DIAGNOSIS — S99921A Unspecified injury of right foot, initial encounter: Secondary | ICD-10-CM | POA: Diagnosis present

## 2023-04-14 NOTE — ED Triage Notes (Signed)
Pt c/o RT foot pain s/p a machine fell on it at work; foot was caught for about 3 mins until someone could lift it off of him; significant swelling from mid foot to toes

## 2023-04-14 NOTE — ED Provider Notes (Signed)
[facility] Provider Note   CSN: [id number] Arrival date & time: 04/14/23  2145     History  Chief Complaint  Patient presents with   Foot Injury    Martin Payne is a 27 y.o. male with no pertinent past medical history presented with right foot pain that occurred a p.m. this evening when a machine part dropped on his foot.  Patient be able to walk since then and does not take any pain meds.  Patient does state that his foot does appear swollen but is able to move his toes and denies any skin color changes or open wounds or bleeding.  Patient denies any ankle pain.  Home Medications Prior to Admission medications   Medication Sig Start Date End Date Taking? Authorizing Provider  hydrocortisone (ANUSOL-HC) 2.5 % rectal cream Place 1 Application rectally 2 (two) times daily. 05/06/22   Franne Forts, DO      Allergies    Shellfish allergy    Review of Systems   Review of Systems  Physical Exam Updated Vital Signs BP 129/88 (BP Location: Right Arm)   Pulse 76   Temp 98.5 F (36.9 C) (Oral)   Resp 20   Ht 5\' 9"  (1.753 m)   Wt 64.9 kg   SpO2 100%   BMI 21.12 kg/m  Physical Exam Vitals reviewed.  Constitutional:      General: He is not in acute distress. Cardiovascular:     Rate and Rhythm: Normal rate.     Pulses: Normal pulses.  Musculoskeletal:        General: Normal range of motion.     Comments: Right foot: Mild edema noted however no bony tenderness or abnormalities, no open wounds, 5 out of 5 ankle flexion/dorsiflexion, able to wiggle toes, negative anterior drawer test Pain not out of proportion Soft compartments  Skin:    General: Skin is warm and dry.     Capillary Refill: Capillary refill takes less than 2 seconds.     Comments: No overlying skin color changes, warmth, discharge  Neurological:     Mental Status: He is alert.     Comments: Sensation intact distally  Psychiatric:        Mood and Affect:  Mood normal.     ED Results / Procedures / Treatments   Labs (all labs ordered are listed, but only abnormal results are displayed) Labs Reviewed - No data to display  EKG None  Radiology DG Foot Complete Right  Result Date: 04/14/2023 CLINICAL DATA:  Right foot pain status post machine falling on it at work. Swelling from input 2 toes. EXAM: RIGHT FOOT COMPLETE - 3+ VIEW COMPARISON:  None Available. FINDINGS: No acute fracture or dislocation. Soft tissue swelling about the dorsum of the foot at the level of the MTP joints. IMPRESSION: No acute fracture or dislocation. Electronically Signed   By: Minerva Fester M.D.   On: 04/14/2023 22:43    Procedures Procedures    Medications Ordered in ED Medications - No data to display  ED Course/ Medical Decision Making/ A&P                                 Medical Decision Making Amount and/or Complexity of Data Reviewed Radiology: ordered.   Martin Payne 27 y.o. presented today for right foot pain. Working DDx that I considered at this time includes, but not limited  to, contusion, strain/sprain, fracture, dislocation, neurovascular compromise, septic joint, ischemic limb, compartment syndrome.  R/o DDx: strain/sprain, fracture, dislocation, neurovascular compromise, septic joint, ischemic limb, compartment syndrome: These are considered less likely due to history of present illness, physical exam, labs/imaging findings.  Review of prior external notes: 12-23 ED  Unique Tests and My Interpretation:  Right foot x-ray: No acute osseous changes  Social Determinants of Health: none  Discussion with Independent Historian:  Brother  Discussion of Management of Tests: None  Risk: Low: based on diagnostic testing/clinical impression and treatment plan  Risk Stratification Score: None  Plan: On exam patient was in no acute distress stable vitals.  On exam patient is neuro vastly intact and does have some swelling on his right  foot but otherwise no other concerning features.  X-ray was negative for any fractures and so this time suspect patient has contusion to his foot and will give crutches to help him ambulate and encouraged RICE therapy and to follow-up with her primary care provider.  Work note given at patient's request.  Patient was given return precautions. Patient stable for discharge at this time.  Patient verbalized understanding of plan.  This chart was dictated using voice recognition software.  Despite best efforts to proofread,  errors can occur which can change the documentation meaning.         Final Clinical Impression(s) / ED Diagnoses Final diagnoses:  Contusion of right foot, initial encounter    Rx / DC Orders ED Discharge Orders     None         Remi Deter 04/14/23 2323    Glyn Ade, MD 04/15/23 1458

## 2023-04-14 NOTE — Discharge Instructions (Signed)
Please follow-up with your primary care provider in regards to recent ER visit. Today physical exam and imaging was reassuring and you most likely have a benign process going on. You may rest, ice, compress, elevate your extremity and use Tylenol every 6 hours as needed for pain. If you begin to have decreased sensation, skin color changes, pain out of proportion/not controlled by over-the-counter medications, rigid compartments, or other worsening of symptoms please return to ER.

## 2023-04-14 NOTE — ED Notes (Signed)
Pt refused crutches.
